# Patient Record
Sex: Female | Born: 1975 | Race: White | Hispanic: No | Marital: Single | Smoking: Current every day smoker
Health system: Southern US, Community
[De-identification: ages and names within clinical notes are randomized; demographics above are authoritative.]

## PROBLEM LIST (undated history)

## (undated) DIAGNOSIS — I10 Essential (primary) hypertension: Secondary | ICD-10-CM

## (undated) DIAGNOSIS — J449 Chronic obstructive pulmonary disease, unspecified: Secondary | ICD-10-CM

## (undated) DIAGNOSIS — J45909 Unspecified asthma, uncomplicated: Secondary | ICD-10-CM

## (undated) DIAGNOSIS — E119 Type 2 diabetes mellitus without complications: Secondary | ICD-10-CM

---

## 2021-03-08 ENCOUNTER — Other Ambulatory Visit: Payer: Self-pay

## 2021-03-08 ENCOUNTER — Encounter (HOSPITAL_COMMUNITY): Payer: Self-pay | Admitting: *Deleted

## 2021-03-08 ENCOUNTER — Emergency Department (HOSPITAL_COMMUNITY)
Admission: EM | Admit: 2021-03-08 | Discharge: 2021-03-08 | Discharge status: Against Medical Advice | Payer: No Typology Code available for payment source | Attending: Emergency Medicine | Admitting: Emergency Medicine

## 2021-03-08 ENCOUNTER — Emergency Department (EMERGENCY_DEPARTMENT_HOSPITAL)
Admission: EM | Admit: 2021-03-08 | Discharge: 2021-03-11 | Disposition: A | Discharge status: Home / Self Care | Payer: No Typology Code available for payment source | Source: Home / Self Care | Attending: Emergency Medicine | Admitting: Emergency Medicine

## 2021-03-08 DIAGNOSIS — F17200 Nicotine dependence, unspecified, uncomplicated: Secondary | ICD-10-CM | POA: Insufficient documentation

## 2021-03-08 DIAGNOSIS — F29 Unspecified psychosis not due to a substance or known physiological condition: Secondary | ICD-10-CM | POA: Insufficient documentation

## 2021-03-08 DIAGNOSIS — Z7984 Long term (current) use of oral hypoglycemic drugs: Secondary | ICD-10-CM | POA: Insufficient documentation

## 2021-03-08 DIAGNOSIS — R456 Violent behavior: Secondary | ICD-10-CM | POA: Diagnosis present

## 2021-03-08 DIAGNOSIS — Z79899 Other long term (current) drug therapy: Secondary | ICD-10-CM | POA: Insufficient documentation

## 2021-03-08 DIAGNOSIS — Z20822 Contact with and (suspected) exposure to covid-19: Secondary | ICD-10-CM | POA: Diagnosis not present

## 2021-03-08 DIAGNOSIS — F4320 Adjustment disorder, unspecified: Secondary | ICD-10-CM

## 2021-03-08 DIAGNOSIS — F99 Mental disorder, not otherwise specified: Secondary | ICD-10-CM

## 2021-03-08 DIAGNOSIS — F23 Brief psychotic disorder: Secondary | ICD-10-CM | POA: Diagnosis not present

## 2021-03-08 LAB — COMPREHENSIVE METABOLIC PANEL
ALT: 41 U/L (ref 0–44)
AST: 22 U/L (ref 15–41)
Albumin: 4.1 g/dL (ref 3.5–5.0)
Alkaline Phosphatase: 48 U/L (ref 38–126)
Anion gap: 8 (ref 5–15)
BUN: 9 mg/dL (ref 6–20)
CO2: 28 mmol/L (ref 22–32)
Calcium: 9.1 mg/dL (ref 8.9–10.3)
Chloride: 101 mmol/L (ref 98–111)
Creatinine, Ser: 0.89 mg/dL (ref 0.44–1.00)
GFR, Estimated: >60 mL/min (ref >60)
Glucose, Bld: 92 mg/dL (ref 70–99)
Potassium: 4.3 mmol/L (ref 3.5–5.1)
Sodium: 137 mmol/L (ref 135–145)
Total Bilirubin: 0.8 mg/dL (ref 0.3–1.2)
Total Protein: 7.5 g/dL (ref 6.5–8.1)

## 2021-03-08 LAB — CBC
HCT: 44.2 % (ref 36.0–46.0)
Hemoglobin: 14.3 g/dL (ref 12.0–15.0)
MCH: 28.9 pg (ref 26.0–34.0)
MCHC: 32.4 g/dL (ref 30.0–36.0)
MCV: 89.5 fL (ref 80.0–100.0)
Platelets: 388 10*3/uL (ref 150–400)
RBC: 4.94 MIL/uL (ref 3.87–5.11)
RDW: 14.7 % (ref 11.5–15.5)
WBC: 12.5 10*3/uL — ABNORMAL HIGH (ref 4.0–10.5)
nRBC: 0 % (ref 0.0–0.2)

## 2021-03-08 LAB — RAPID URINE DRUG SCREEN, HOSP PERFORMED
Amphetamines: NOT DETECTED
Barbiturates: NOT DETECTED
Benzodiazepines: NOT DETECTED
Cocaine: NOT DETECTED
Opiates: NOT DETECTED
Tetrahydrocannabinol: NOT DETECTED

## 2021-03-08 LAB — ETHANOL: Alcohol, Ethyl (B): <10 mg/dL (ref <10)

## 2021-03-08 LAB — RESP PANEL BY RT-PCR (FLU A&B, COVID) ARPGX2
Influenza A by PCR: NEGATIVE
Influenza B by PCR: NEGATIVE
SARS Coronavirus 2 by RT PCR: NEGATIVE

## 2021-03-08 LAB — POC URINE PREG, ED: Preg Test, Ur: NEGATIVE

## 2021-03-08 MED ORDER — ZIPRASIDONE MESYLATE 20 MG IM SOLR: 10.0000 mg | Freq: Once | INTRAMUSCULAR | Status: DC

## 2021-03-08 NOTE — ED Notes (Signed)
Pt's cigarettes brought in by [employer] and placed in her belongings bag.

## 2021-03-08 NOTE — ED Triage Notes (Signed)
Patient states she needs her shot

## 2021-03-08 NOTE — ED Notes (Signed)
Pt dressed in scrubs and wanded by security
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
## 2021-03-08 NOTE — Progress Notes (Signed)
Patient information has been sent to [facility] via secure chat to review for potential admission. Patient meets inpatient criteria per Liborio Nixon, NP.   Situation ongoing, CSW will continue to monitor progress.    Signed:  Corky Crafts, MSW, [place], LCASA 03/08/2021 10:51 PM
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
## 2021-03-08 NOTE — ED Notes (Addendum)
Primary nurse went to talk with pt per request. Pt wanted to know when she could go home and RN educated the pt on the process. Pt still does not understand why she is here or why she is being evaluated. Primary RN got secondary RN to explain the process over again to the pt seemed satisfied at the explanation. Primary RN gave pt drink and crackers and no other needs were expressed. Will continue to monitor pt.

## 2021-03-08 NOTE — ED Notes (Signed)
Pt ambulated off unit
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
## 2021-03-08 NOTE — ED Triage Notes (Signed)
Brought in for evaluation and IVC EVALUATION

## 2021-03-08 NOTE — ED Notes (Signed)
Provider requests IVC for pt, pt has already ambulated off hospital property; [employer] notified and will send officer to pick up pt and bring her in for IVC

## 2021-03-08 NOTE — ED Notes (Signed)
Pt being assessed by TTS 

## 2021-03-08 NOTE — ED Notes (Signed)
LEO at bedside to explain IVC process and procedure to the patient. Patient agrees and verbalizes understanding of IVC process and states she will cooperate with exam. Patient requesting cigarette and updated on facility policy regarding tobacco. Patient's clothing placed in locked belongings on top shelf and labeled. Security at bedside to wand patient.
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
## 2021-03-09 MED ORDER — BUPROPION HCL ER (XL) 150 MG PO TB24
150.0000 mg | ORAL_TABLET | Freq: Every day | ORAL | Status: DC
  Administered 2021-03-09 – 2021-03-10 (×2): 150 mg via ORAL
  Filled 2021-03-09 (×2): qty 1

## 2021-03-09 MED ORDER — MIRTAZAPINE 15 MG PO TABS
7.5000 mg | ORAL_TABLET | Freq: Every day | ORAL | Status: DC
  Administered 2021-03-09 – 2021-03-10 (×2): 7.5 mg via ORAL
  Filled 2021-03-09 (×2): qty 1

## 2021-03-09 MED ORDER — MOMETASONE FURO-FORMOTEROL FUM 100-5 MCG/ACT IN AERO
2.0000 | INHALATION_SPRAY | Freq: Two times a day (BID) | RESPIRATORY_TRACT | Status: DC
  Administered 2021-03-09 – 2021-03-11 (×4): 2 via RESPIRATORY_TRACT
  Filled 2021-03-09: qty 8.8

## 2021-03-09 MED ORDER — METFORMIN HCL 500 MG PO TABS
500.0000 mg | ORAL_TABLET | Freq: Every day | ORAL | Status: DC
  Administered 2021-03-10: 500 mg via ORAL
  Filled 2021-03-09: qty 1

## 2021-03-09 MED ORDER — PALIPERIDONE PALMITATE ER 410 MG/1.32ML IM SUSY: 410.0000 mg | PREFILLED_SYRINGE | INTRAMUSCULAR | Status: DC

## 2021-03-09 MED ORDER — RISPERIDONE 1 MG PO TABS
1.0000 mg | ORAL_TABLET | Freq: Every day | ORAL | Status: DC
  Administered 2021-03-09 – 2021-03-10 (×2): 1 mg via ORAL
  Filled 2021-03-09 (×2): qty 1

## 2021-03-09 NOTE — ED Notes (Signed)
Pt given lunch tray.

## 2021-03-09 NOTE — ED Provider Notes (Addendum)
Home meds ordered.   Linwood Dibbles, MD 03/09/21 1953  Notified by pharmacy.  Hinda Glatter is not available in the hospital    Linwood Dibbles, MD 03/09/21 [phone]

## 2021-03-09 NOTE — ED Notes (Signed)
Pt given breakfast tray

## 2021-03-09 NOTE — ED Notes (Signed)
Pt is resting in bed. Will assess vitals when pt wakes up.

## 2021-03-09 NOTE — BH Assessment (Signed)
Disposition: Per Vernard Gambles, NP, patient continues to meet criteria for inpatient psychiatric treatment. Eber Jones, contacted Dr. Iantha Fallen and discussed restarting medications including the Invega injection and discussed caregiver concerns. [facility] AC Selena Batten, RN) notified of patient's bed needs.

## 2021-03-09 NOTE — ED Notes (Signed)
TTS with pt at this time.  
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
## 2021-03-09 NOTE — ED Notes (Signed)
TTS in progress 

## 2021-03-09 NOTE — Progress Notes (Signed)
Under review at [facility].  Penni Homans, MSW, LCSW 03/09/2021 10:17 AM

## 2021-03-10 MED ORDER — PALIPERIDONE PALMITATE ER 234 MG/1.5ML IM SUSY
234.0000 mg | PREFILLED_SYRINGE | Freq: Once | INTRAMUSCULAR | Status: AC
  Administered 2021-03-10: 234 mg via INTRAMUSCULAR
  Filled 2021-03-10: qty 1.5

## 2021-03-10 NOTE — ED Notes (Signed)
Patient asking about getting a shower this morning. Patient asking if she can get a razor, let patient know that she is not allowed to have one. Patient back in room.

## 2021-03-10 NOTE — Progress Notes (Addendum)
Contact Ned Clines, caregiver for Elaine White. Informed her the Invega 256 mg QM injection was administered to the patient today. Caregiver states she will be in [employer] in the Morning at 10 AM and is willing to have patient discharged home.

## 2021-03-10 NOTE — Progress Notes (Addendum)
Consulted Case with Peggye Fothergill RPH:  Elaine White 410mg  Q3M is not available in hospital pharmacy. Invega 256 mg QM injection is available.   Discussed with caregiver  , she agrees with plan to administer the Invega monthly injection.   Contacted [facility] EDP Dr. Ned Clines and discussed case, he is in agreement with one month injection.  Order placed for Invega 256 mg QM one time injection by Estell Harpin NP.

## 2021-03-10 NOTE — ED Notes (Signed)
Patient requesting food and drink at this time.

## 2021-03-11 DIAGNOSIS — F23 Brief psychotic disorder: Secondary | ICD-10-CM

## 2021-03-11 MED ORDER — RISPERIDONE 1 MG PO TABS: 1.0000 mg | ORAL_TABLET | Freq: Every day | ORAL | 0 refills | Status: AC

## 2021-03-11 MED ORDER — MIRTAZAPINE 7.5 MG PO TABS: 7.5000 mg | ORAL_TABLET | Freq: Every day | ORAL | 0 refills | Status: AC

## 2021-03-11 MED ORDER — BUPROPION HCL ER (XL) 150 MG PO TB24: 1.0000 | ORAL_TABLET | Freq: Every day | ORAL | 0 refills | Status: AC | PRN

## 2021-03-11 MED ORDER — VITAMIN D (ERGOCALCIFEROL) 1.25 MG (50000 UNIT) PO CAPS: 50000.0000 [IU] | ORAL_CAPSULE | ORAL | 0 refills | Status: AC

## 2021-03-11 MED ORDER — SYMBICORT 80-4.5 MCG/ACT IN AERO: 2.0000 | INHALATION_SPRAY | RESPIRATORY_TRACT | 0 refills | Status: AC | PRN

## 2021-03-11 MED ORDER — METFORMIN HCL 500 MG PO TABS: 1.0000 | ORAL_TABLET | Freq: Every day | ORAL | 0 refills | Status: AC

## 2021-03-11 NOTE — ED Notes (Signed)
Rescinded IVC Papers faxed to Magistrate. 
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
## 2022-01-31 ENCOUNTER — Other Ambulatory Visit: Payer: Self-pay | Admitting: Adult Health

## 2022-01-31 DIAGNOSIS — Z1231 Encounter for screening mammogram for malignant neoplasm of breast: Secondary | ICD-10-CM

## 2022-03-10 ENCOUNTER — Ambulatory Visit
Admission: RE | Admit: 2022-03-10 | Discharge: 2022-03-10 | Disposition: A | Discharge status: Home / Self Care | Payer: Medicaid Other | Source: Ambulatory Visit | Attending: Adult Health | Admitting: Adult Health

## 2022-03-10 DIAGNOSIS — Z1231 Encounter for screening mammogram for malignant neoplasm of breast: Secondary | ICD-10-CM

## 2022-03-20 ENCOUNTER — Other Ambulatory Visit: Payer: Self-pay | Admitting: Adult Health

## 2022-03-20 DIAGNOSIS — R928 Other abnormal and inconclusive findings on diagnostic imaging of breast: Secondary | ICD-10-CM

## 2022-03-20 DIAGNOSIS — N6489 Other specified disorders of breast: Secondary | ICD-10-CM

## 2022-03-29 ENCOUNTER — Other Ambulatory Visit: Payer: Medicaid Other

## 2022-04-14 ENCOUNTER — Ambulatory Visit
Admission: RE | Admit: 2022-04-14 | Discharge: 2022-04-14 | Disposition: A | Discharge status: Home / Self Care | Payer: Medicaid Other | Source: Ambulatory Visit | Attending: Adult Health | Admitting: Adult Health

## 2022-04-14 DIAGNOSIS — N6489 Other specified disorders of breast: Secondary | ICD-10-CM | POA: Diagnosis present

## 2022-04-14 DIAGNOSIS — R928 Other abnormal and inconclusive findings on diagnostic imaging of breast: Secondary | ICD-10-CM

## 2023-04-10 IMAGING — MG MM DIGITAL DIAGNOSTIC UNILAT*L* W/ TOMO W/ CAD
6 series · 6 of 18 positions shown · non-contrast
Comparison: Previous exam(s).

CLINICAL DATA: 45-year-old female presenting as a recall from
baseline screening for possible left breast asymmetries.

EXAM:
DIGITAL DIAGNOSTIC UNILATERAL LEFT MAMMOGRAM WITH TOMOSYNTHESIS AND
CAD
TECHNIQUE: Left digital diagnostic mammography and breast tomosynthesis was
performed. The images were evaluated with computer-aided detection.

[L XCCL synth-2D (1 of 2)]
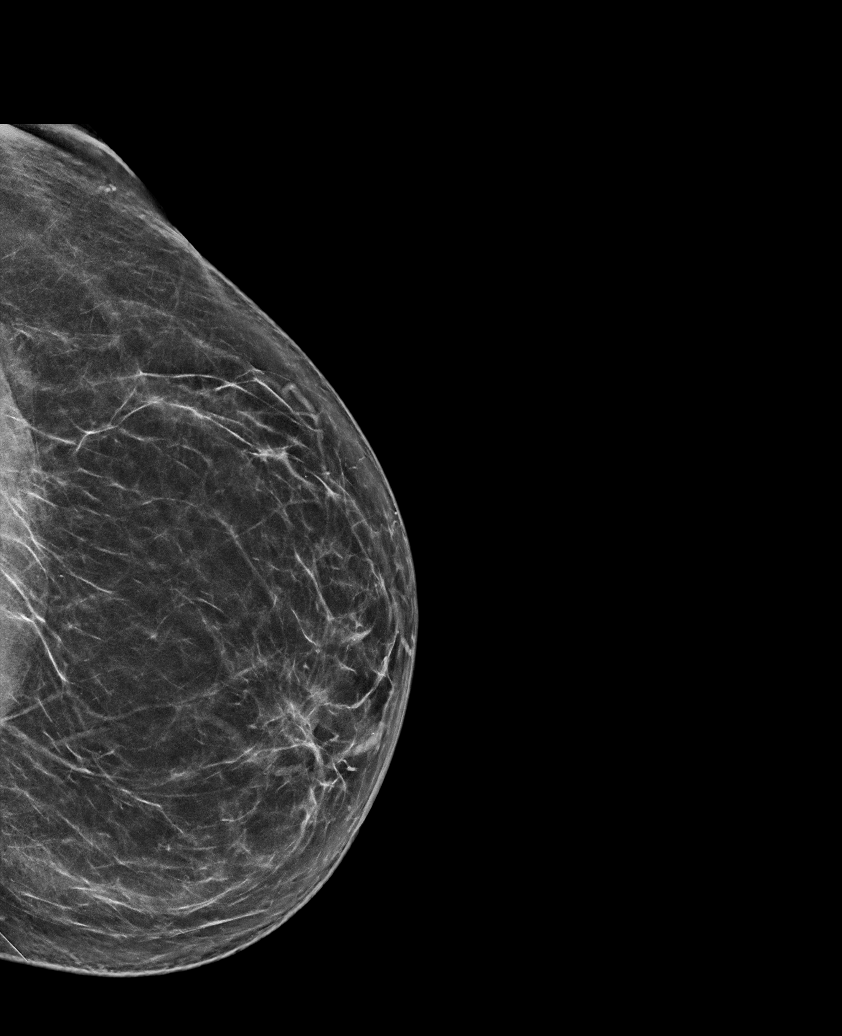

[L ML synth-2D]
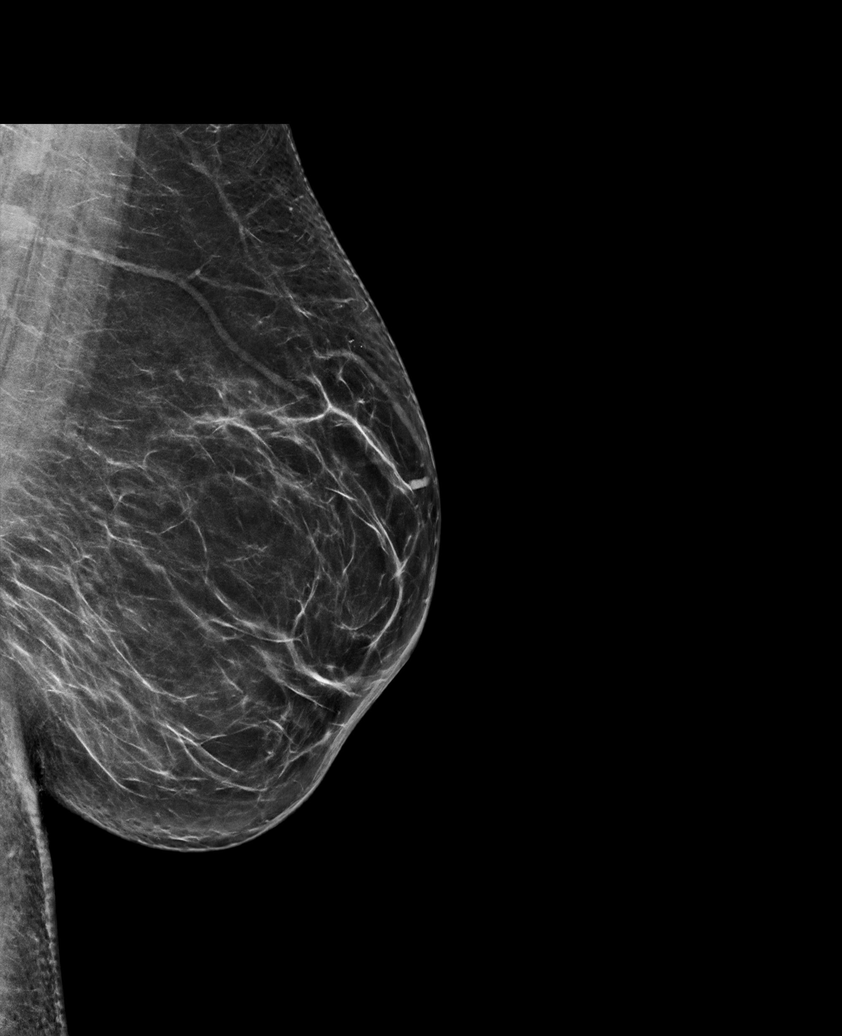

[L XCCL synth-2D (2 of 2)]
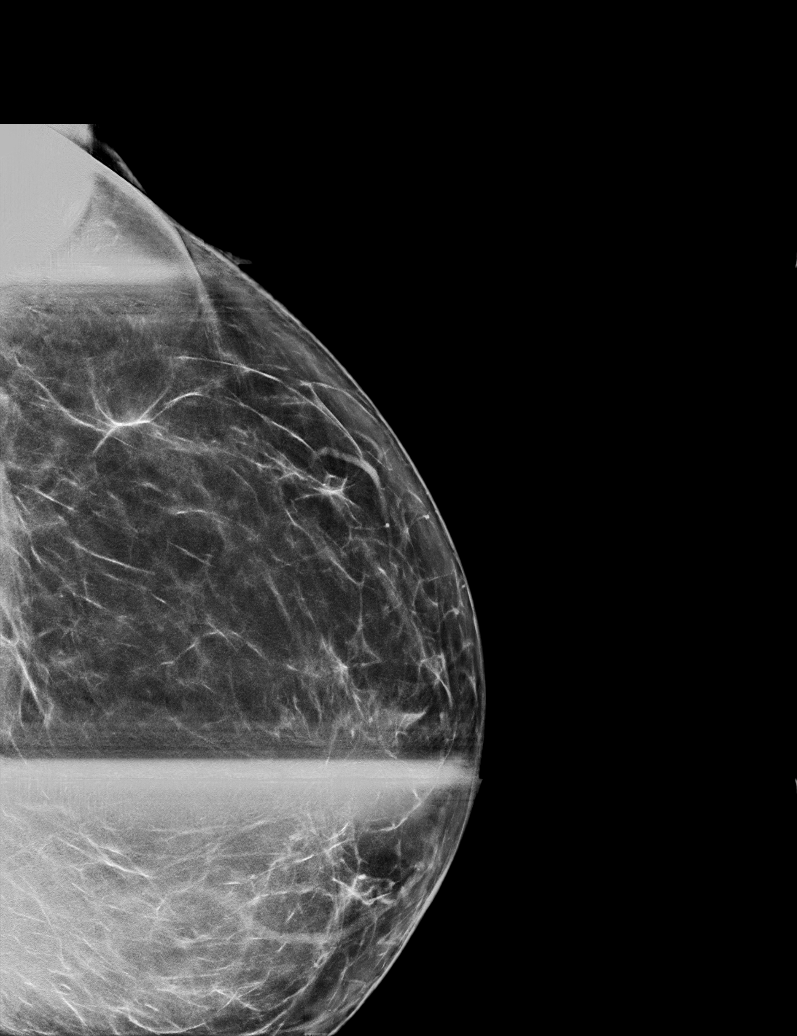

[L XCCL tomo (1 of 2) · tomo slice 35/69.0]
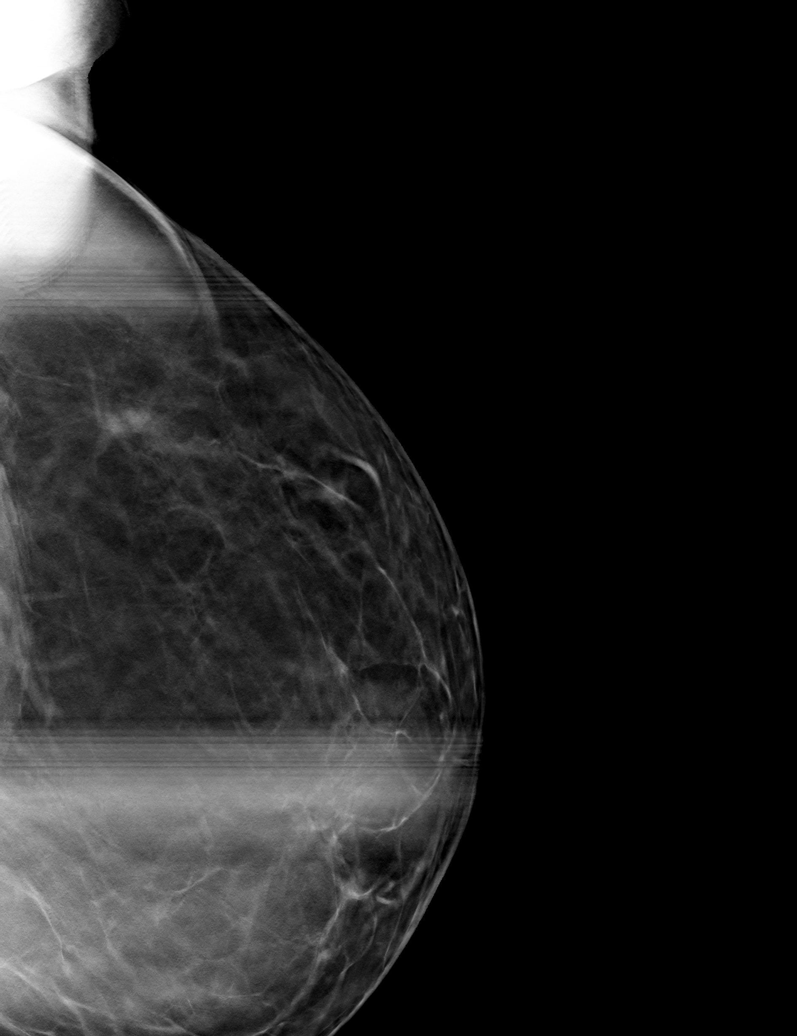

[L ML tomo · tomo slice 41/82.0]
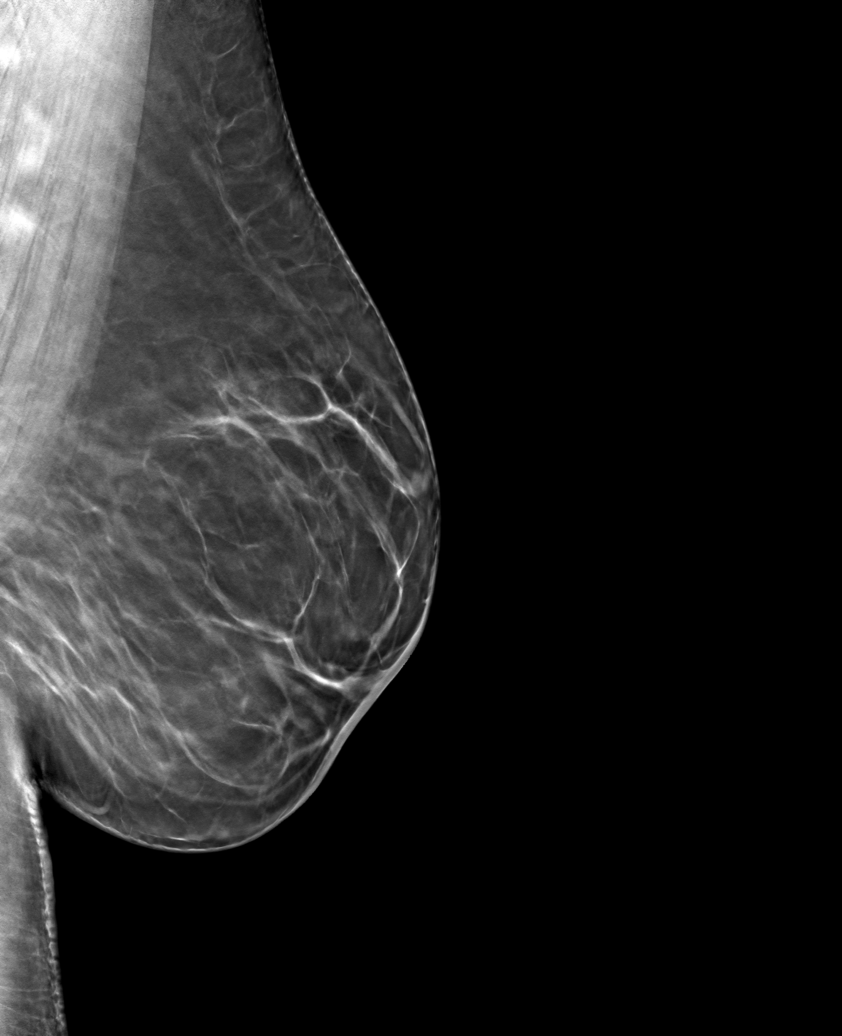

[L XCCL tomo (2 of 2) · tomo slice 36/71.0]
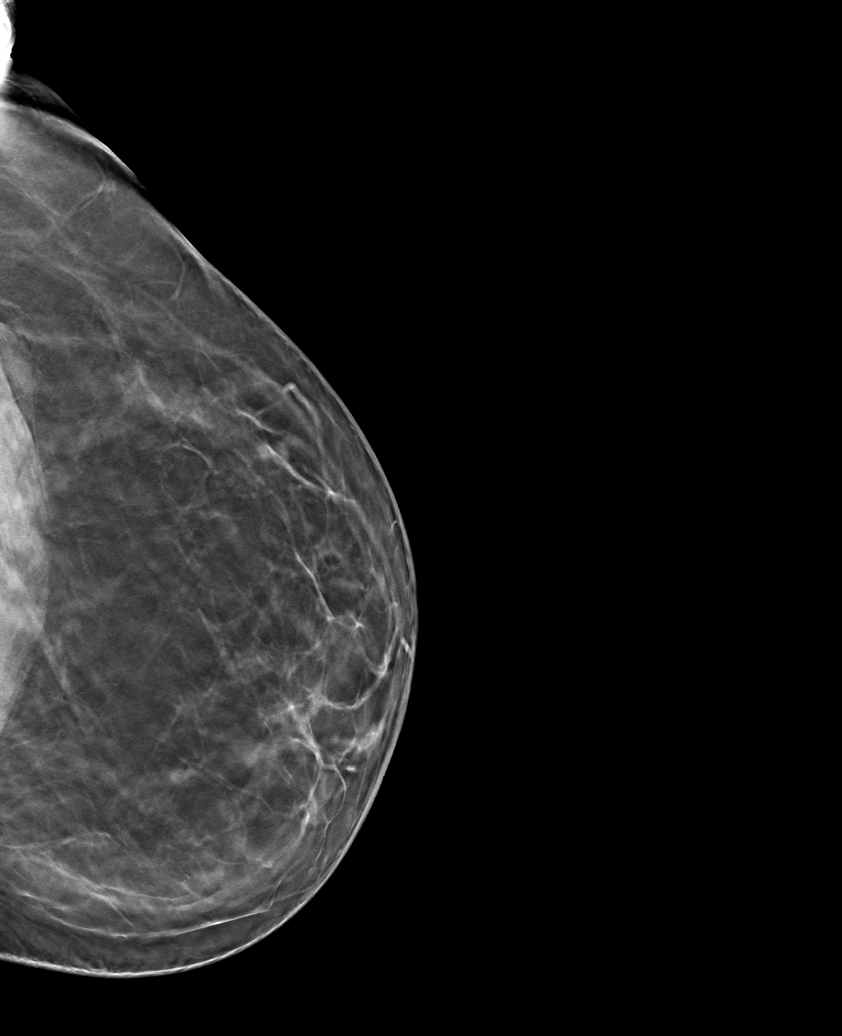

[6 of 18 positions shown; findings below may reference images not displayed]

ACR Breast Density Category b: There are scattered areas of
fibroglandular density.
FINDINGS: Full field exaggerated lateral CC and full true lateral views as
well as spot compression tomosynthesis CC views of the left breast
were performed in addition to standard views for questioned
asymmetries in the outer left breast. On the additional imaging the
tissue in this area disperses without persistent asymmetry, mass, or
true distortion. The findings on screening mammogram are most
consistent with normal overlapping fibroglandular tissue.
IMPRESSION: No mammographic evidence of malignancy in the left breast.

RECOMMENDATION:
Screening mammogram in one year.(Code:LO-O-2LS)

I have discussed the findings and recommendations with the patient.
If applicable, a reminder letter will be sent to the patient
regarding the next appointment.

BI-RADS CATEGORY  1: Negative.

## 2023-09-20 ENCOUNTER — Ambulatory Visit: Payer: MEDICAID

## 2023-09-20 DIAGNOSIS — Z719 Counseling, unspecified: Secondary | ICD-10-CM

## 2023-09-20 DIAGNOSIS — Z23 Encounter for immunization: Secondary | ICD-10-CM | POA: Diagnosis not present

## 2023-09-20 NOTE — Progress Notes (Signed)
In nurse clinic with group home and needing flu vaccine.  See immunization flowsheet.  VIS given.  Flu vaccine given and tolerated well.  [facility] updated and copy given.  Pts first name spelling did not match in epic and [facility] with [place] identification card/picture (see scanned ID card).  Correction made.  Cherlynn Polo, RN

## 2023-12-18 ENCOUNTER — Encounter: Payer: Self-pay | Admitting: Internal Medicine

## 2023-12-24 ENCOUNTER — Ambulatory Visit: Payer: MEDICAID | Admitting: Family Medicine

## 2024-02-06 ENCOUNTER — Ambulatory Visit: Admission: RE | Admit: 2024-02-06 | Payer: MEDICAID | Source: Home / Self Care | Admitting: Internal Medicine

## 2024-02-06 SURGERY — COLONOSCOPY WITH PROPOFOL
Anesthesia: General

## 2024-04-10 ENCOUNTER — Encounter: Payer: Self-pay | Admitting: Internal Medicine

## 2024-04-29 ENCOUNTER — Encounter: Payer: Self-pay | Admitting: Internal Medicine

## 2024-04-30 ENCOUNTER — Ambulatory Visit: Payer: MEDICAID | Admitting: Anesthesiology

## 2024-04-30 ENCOUNTER — Encounter: Payer: Self-pay | Admitting: Internal Medicine

## 2024-04-30 ENCOUNTER — Encounter
Admission: RE | Disposition: A | Discharge status: Home / Self Care | Payer: Self-pay | Source: Home / Self Care | Attending: Internal Medicine

## 2024-04-30 ENCOUNTER — Ambulatory Visit
Admission: RE | Admit: 2024-04-30 | Discharge: 2024-04-30 | Disposition: A | Discharge status: Home / Self Care | Payer: MEDICAID | Attending: Internal Medicine | Admitting: Internal Medicine

## 2024-04-30 DIAGNOSIS — D125 Benign neoplasm of sigmoid colon: Secondary | ICD-10-CM | POA: Insufficient documentation

## 2024-04-30 DIAGNOSIS — E119 Type 2 diabetes mellitus without complications: Secondary | ICD-10-CM | POA: Diagnosis not present

## 2024-04-30 DIAGNOSIS — Z1211 Encounter for screening for malignant neoplasm of colon: Secondary | ICD-10-CM | POA: Diagnosis present

## 2024-04-30 DIAGNOSIS — Z7951 Long term (current) use of inhaled steroids: Secondary | ICD-10-CM | POA: Diagnosis not present

## 2024-04-30 DIAGNOSIS — F17210 Nicotine dependence, cigarettes, uncomplicated: Secondary | ICD-10-CM | POA: Diagnosis not present

## 2024-04-30 DIAGNOSIS — Z7984 Long term (current) use of oral hypoglycemic drugs: Secondary | ICD-10-CM | POA: Diagnosis not present

## 2024-04-30 DIAGNOSIS — J4489 Other specified chronic obstructive pulmonary disease: Secondary | ICD-10-CM | POA: Diagnosis not present

## 2024-04-30 DIAGNOSIS — I10 Essential (primary) hypertension: Secondary | ICD-10-CM | POA: Insufficient documentation

## 2024-04-30 DIAGNOSIS — K640 First degree hemorrhoids: Secondary | ICD-10-CM | POA: Diagnosis not present

## 2024-04-30 HISTORY — PX: POLYPECTOMY: SHX149

## 2024-04-30 HISTORY — DX: Unspecified asthma, uncomplicated: J45.909

## 2024-04-30 HISTORY — PX: COLONOSCOPY: SHX5424

## 2024-04-30 HISTORY — DX: Essential (primary) hypertension: I10

## 2024-04-30 HISTORY — DX: Type 2 diabetes mellitus without complications: E11.9

## 2024-04-30 HISTORY — DX: Chronic obstructive pulmonary disease, unspecified: J44.9

## 2024-04-30 LAB — GLUCOSE, CAPILLARY: Glucose-Capillary: 93 mg/dL (ref 70–99)

## 2024-04-30 LAB — POCT PREGNANCY, URINE: Preg Test, Ur: NEGATIVE

## 2024-04-30 SURGERY — COLONOSCOPY
Anesthesia: General

## 2024-04-30 MED ORDER — LIDOCAINE HCL (CARDIAC) PF 100 MG/5ML IV SOSY
PREFILLED_SYRINGE | INTRAVENOUS | Status: DC | PRN
  Administered 2024-04-30: 80 mg via INTRAVENOUS

## 2024-04-30 MED ORDER — SODIUM CHLORIDE 0.9 % IV SOLN: INTRAVENOUS | Status: DC

## 2024-04-30 MED ORDER — PROPOFOL 500 MG/50ML IV EMUL
INTRAVENOUS | Status: DC | PRN
  Administered 2024-04-30: 75 ug/kg/min via INTRAVENOUS

## 2024-04-30 MED ORDER — EPHEDRINE SULFATE-NACL 50-0.9 MG/10ML-% IV SOSY
PREFILLED_SYRINGE | INTRAVENOUS | Status: DC | PRN
  Administered 2024-04-30: 10 mg via INTRAVENOUS
  Administered 2024-04-30: 15 mg via INTRAVENOUS

## 2024-04-30 MED ORDER — DEXMEDETOMIDINE HCL IN NACL 80 MCG/20ML IV SOLN
INTRAVENOUS | Status: AC
Start: 2024-04-30 — End: 2024-04-30
  Filled 2024-04-30: qty 20

## 2024-04-30 MED ORDER — EPHEDRINE 5 MG/ML INJ
INTRAVENOUS | Status: AC
  Filled 2024-04-30: qty 5

## 2024-04-30 MED ORDER — PROPOFOL 10 MG/ML IV BOLUS
INTRAVENOUS | Status: DC | PRN
Start: 2024-04-30 — End: 2024-04-30
  Administered 2024-04-30 (×2): 40 mg via INTRAVENOUS
  Administered 2024-04-30: 50 mg via INTRAVENOUS

## 2024-04-30 MED ORDER — LIDOCAINE HCL (PF) 2 % IJ SOLN
INTRAMUSCULAR | Status: AC
  Filled 2024-04-30: qty 10

## 2024-04-30 MED ORDER — DEXMEDETOMIDINE HCL IN NACL 80 MCG/20ML IV SOLN
INTRAVENOUS | Status: DC | PRN
  Administered 2024-04-30: 20 ug via INTRAVENOUS

## 2024-04-30 MED ORDER — PHENYLEPHRINE 80 MCG/ML (10ML) SYRINGE FOR IV PUSH (FOR BLOOD PRESSURE SUPPORT)
PREFILLED_SYRINGE | INTRAVENOUS | Status: AC
  Filled 2024-04-30: qty 10

## 2024-04-30 NOTE — Anesthesia Preprocedure Evaluation (Signed)
 Anesthesia Evaluation  Patient identified by MRN, date of birth, ID band Patient awake    Reviewed: Allergy & Precautions, NPO status , Patient's Chart, lab work & pertinent test results  History of Anesthesia Complications Negative for: history of anesthetic complications  Airway Mallampati: III   Neck ROM: Full    Dental  (+) Missing   Pulmonary asthma , COPD, Current Smoker (4 cigarettes per day) and Patient abstained from smoking.   Pulmonary exam normal breath sounds clear to auscultation       Cardiovascular hypertension, Normal cardiovascular exam Rhythm:Regular Rate:Normal     Neuro/Psych negative neurological ROS     GI/Hepatic negative GI ROS,,,  Endo/Other  diabetes, Type 2  Obesity   Renal/GU negative Renal ROS     Musculoskeletal   Abdominal   Peds  Hematology negative hematology ROS (+)   Anesthesia Other Findings   Reproductive/Obstetrics                             Anesthesia Physical Anesthesia Plan  ASA: 2  Anesthesia Plan: General   Post-op Pain Management:    Induction: Intravenous  PONV Risk Score and Plan: 2 and Propofol infusion, TIVA and Treatment may vary due to age or medical condition  Airway Management Planned: Natural Airway  Additional Equipment:   Intra-op Plan:   Post-operative Plan:   Informed Consent: I have reviewed the patients History and Physical, chart, labs and discussed the procedure including the risks, benefits and alternatives for the proposed anesthesia with the patient or authorized representative who has indicated his/her understanding and acceptance.     Consent reviewed with POA  Plan Discussed with: CRNA  Anesthesia Plan Comments: (History and consent obtained from patient and group home guardian at bedside. LMA/GETA backup discussed.  Patient and guardian consented for risks of anesthesia including but not limited to:   - adverse reactions to medications - damage to eyes, teeth, lips or other oral mucosa - nerve damage due to positioning  - sore throat or hoarseness - damage to heart, brain, nerves, lungs, other parts of body or loss of life  Informed patient and guardian about role of CRNA in peri- and intra-operative care; they voiced understanding.)       Anesthesia Quick Evaluation

## 2024-04-30 NOTE — Transfer of Care (Signed)
 Immediate Anesthesia Transfer of Care Note  Patient: Elaine White  Procedure(s) Performed: COLONOSCOPY POLYPECTOMY, INTESTINE  Patient Location: PACU  Anesthesia Type:General  Level of Consciousness: sedated  Airway & Oxygen  Therapy: Patient Spontanous Breathing  Post-op Assessment: Report given to RN and Post -op Vital signs reviewed and stable  Post vital signs: Reviewed and stable  Last Vitals:  Vitals Value Taken Time  BP 93/59 04/30/24 10:18  Temp 36.3 C 04/30/24 10:18  Pulse 87 04/30/24 10:20  Resp 21 04/30/24 10:20  SpO2 96 % 04/30/24 10:20  Vitals shown include unfiled device data.  Last Pain:  Vitals:   04/30/24 1018  TempSrc: Temporal  PainSc: 0-No pain         Complications: No notable events documented.

## 2024-04-30 NOTE — Op Note (Signed)
 [facility] Gastroenterology Patient Name: Elaine White Procedure Date: 04/30/2024 9:59 AM MRN: [id number] Account #: [id number] Date of Birth: Feb 25, 1976 Admit Type: Outpatient Age: 48 Room: [facility] ENDO ROOM 3 Gender: Female Note Status: Finalized Instrument Name: [id number] Procedure:             Colonoscopy Indications:           Screening for colorectal malignant neoplasm Providers:             Tansy Lorek K. Maxxwell Edgett MD, MD Medicines:             Propofol per Anesthesia Complications:         No immediate complications. Estimated blood loss:                         Minimal. Procedure:             Pre-Anesthesia Assessment:                        - The risks and benefits of the procedure and the                         sedation options and risks were discussed with the                         patient. All questions were answered and informed                         consent was obtained.                        - Patient identification and proposed procedure were                         verified prior to the procedure by the nurse. The                         procedure was verified in the procedure room.                        - ASA Grade Assessment: II - A patient with mild                         systemic disease.                        - After reviewing the risks and benefits, the patient                         was deemed in satisfactory condition to undergo the                         procedure.                        After obtaining informed consent, the colonoscope was                         passed under direct vision. Throughout the procedure,  the patient's blood pressure, pulse, and oxygen                          saturations were monitored continuously. The                         Colonoscope was introduced through the anus and                         advanced to the the cecum, identified by appendiceal                          orifice and ileocecal valve. The colonoscopy was                         performed without difficulty. The patient tolerated                         the procedure well. The quality of the bowel                         preparation was adequate except the descending and                         sigmoid colon prep was fair. The quality of the bowel                         preparation was evaluated using the BBPS [facility] Bowel                         Preparation Scale) with scores of: Right Colon = 3,                         Transverse Colon = 3 and Left Colon = 1. The total                         BBPS score equals 7. The ileocecal valve, appendiceal                         orifice, and rectum were photographed. Findings:      The perianal and digital rectal examinations were normal. Pertinent       negatives include normal sphincter tone and no palpable rectal lesions.      Internal hemorrhoids were found during retroflexion. The hemorrhoids       were Grade I (internal hemorrhoids that do not prolapse).      A 6 mm polyp was found in the sigmoid colon. The polyp was sessile. The       polyp was removed with a cold snare. Resection and retrieval were       complete. Estimated blood loss was minimal.      The exam was otherwise without abnormality. Impression:            - Internal hemorrhoids.                        - One 6 mm polyp in the sigmoid colon, removed with a  cold snare. Resected and retrieved.                        - The examination was otherwise normal. Recommendation:        - Patient has a contact number available for                         emergencies. The signs and symptoms of potential                         delayed complications were discussed with the patient.                         Return to normal activities tomorrow. Written                         discharge instructions were provided to the patient.                        - Resume previous  diet.                        - Continue present medications.                        - Repeat colonoscopy in 3 years for surveillance.                        - Return to GI office PRN.                        - The findings and recommendations were discussed with                         the patient. Procedure Code(s):     --- Professional ---                        [phone], Colonoscopy, flexible; with removal of                         tumor(s), polyp(s), or other lesion(s) by snare                         technique Diagnosis Code(s):     --- Professional ---                        K64.0, First degree hemorrhoids                        D12.5, Benign neoplasm of sigmoid colon                        Z12.11, Encounter for screening for malignant neoplasm                         of colon CPT copyright 2022 [employer]. All rights reserved. The codes documented in this report are preliminary and upon coder review may  be revised to meet current compliance requirements. Ladell MARLA Boss MD, MD 04/30/2024 10:19:07 AM This report  has been signed electronically. Number of Addenda: 0 Note Initiated On: 04/30/2024 9:59 AM Scope Withdrawal Time: 0 hours 6 minutes 46 seconds  Total Procedure Duration: 0 hours 10 minutes 17 seconds  Estimated Blood Loss:  Estimated blood loss was minimal.      [facility]

## 2024-04-30 NOTE — Interval H&P Note (Signed)
 History and Physical Interval Note:  04/30/2024 9:20 AM  Elaine White  has presented today for surgery, with the diagnosis of Colon cancer screening (Z12.11).  The various methods of treatment have been discussed with the patient and family. After consideration of risks, benefits and other options for treatment, the patient has consented to  Procedure(s) with comments: COLONOSCOPY (N/A) - DM as a surgical intervention.  The patient's history has been reviewed, patient examined, no change in status, stable for surgery.  I have reviewed the patient's chart and labs.  Questions were answered to the patient's satisfaction.     Seguin, Adrien Shankar

## 2024-04-30 NOTE — Anesthesia Postprocedure Evaluation (Signed)
 Anesthesia Post Note  Patient: Elaine White  Procedure(s) Performed: COLONOSCOPY POLYPECTOMY, INTESTINE  Patient location during evaluation: PACU Anesthesia Type: General Level of consciousness: awake and alert, oriented and patient cooperative Pain management: pain level controlled Vital Signs Assessment: post-procedure vital signs reviewed and stable Respiratory status: spontaneous breathing, nonlabored ventilation and respiratory function stable Cardiovascular status: blood pressure returned to baseline and stable Postop Assessment: adequate PO intake Anesthetic complications: no   No notable events documented.   Last Vitals:  Vitals:   04/30/24 1028 04/30/24 1038  BP: (!) 92/56 105/69  Pulse: 73 75  Resp: 17 (!) 21  Temp:    SpO2: 96% 96%    Last Pain:  Vitals:   04/30/24 1038  TempSrc:   PainSc: 0-No pain                 Alfonso Ruths

## 2024-04-30 NOTE — H&P (Signed)
 Outpatient short stay form Pre-procedure 04/30/2024 9:19 AM Elaine White, M.D.  Primary Physician: Channing Schaffer, FNP  Reason for visit:  Colon cancer screening  History of present illness:  Patient presents for colonoscopy for colon cancer screening. The patient denies complaints of abdominal pain, significant change in bowel habits, or rectal bleeding.     No current facility-administered medications for this encounter.  Medications Prior to Admission  Medication Sig Dispense Refill Last Dose/Taking   budesonide-formoterol  (SYMBICORT ) 160-4.5 MCG/ACT inhaler       buPROPion  (WELLBUTRIN  XL) 150 MG 24 hr tablet Take 1 tablet (150 mg total) by mouth daily as needed. 30 tablet 0    FARXIGA 5 MG TABS tablet       INVEGA  TRINZA 410 MG/1. SUSY Inject 410 mg into the muscle every 3 (three) months.      lisinopril (ZESTRIL) 5 MG tablet       metFORMIN  (GLUCOPHAGE ) 500 MG tablet Take 1 tablet (500 mg total) by mouth daily with breakfast. 30 tablet 0    mirtazapine  (REMERON ) 7.5 MG tablet Take 1 tablet (7.5 mg total) by mouth at bedtime. 30 tablet 0    mirtazapine  (REMERON ) 7.5 MG tablet Take by mouth.      naltrexone (DEPADE) 50 MG tablet       polyethylene glycol-electrolytes (NULYTELY) 420 g solution See admin instructions.      risperiDONE  (RISPERDAL ) 1 MG tablet Take 1 tablet (1 mg total) by mouth at bedtime. 30 tablet 0    risperiDONE  (RISPERDAL ) 1 MG tablet Take by mouth.      SYMBICORT  80-4.5 MCG/ACT inhaler Inhale 2 puffs into the lungs every 4 (four) hours as needed (shortness of breath). 1 each 0    Vitamin D , Ergocalciferol , (DRISDOL ) 1.25 MG (50000 UNIT) CAPS capsule Take 1 capsule (50,000 Units total) by mouth every 7 (seven) days. 5 capsule 0      No Known Allergies   Past Medical History:  Diagnosis Date   Asthma    COPD (chronic obstructive pulmonary disease) (HCC)    Diabetes mellitus without complication (HCC)    Hypertension     Review of systems:   Otherwise negative.    Physical Exam  Gen: Alert, oriented. Appears stated age.  HEENT: [place]/AT. PERRLA. Lungs: CTA, no wheezes. CV: RR nl S1, S2. Abd: soft, benign, no masses. BS+ Ext: No edema. Pulses 2+    Planned procedures: Proceed with colonoscopy. The patient understands the nature of the planned procedure, indications, risks, alternatives and potential complications including but not limited to bleeding, infection, perforation, damage to internal organs and possible oversedation/side effects from anesthesia. The patient agrees and gives consent to proceed.  Please refer to procedure notes for findings, recommendations and patient disposition/instructions.     Juwuan Sedita K. White, M.D. Gastroenterology 04/30/2024  9:19 AM

## 2024-05-01 ENCOUNTER — Encounter: Payer: Self-pay | Admitting: Internal Medicine

## 2024-05-01 LAB — SURGICAL PATHOLOGY
